# Patient Record
Sex: Male | Born: 1995 | Race: Black or African American | Hispanic: No | Marital: Single | Smoking: Current every day smoker
Health system: Southern US, Community
[De-identification: ages and names within clinical notes are randomized; demographics above are authoritative.]

## PROBLEM LIST (undated history)

## (undated) DIAGNOSIS — D649 Anemia, unspecified: Secondary | ICD-10-CM

## (undated) HISTORY — PX: NO PAST SURGERIES: SHX2092

---
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
## 2014-08-19 ENCOUNTER — Emergency Department (HOSPITAL_COMMUNITY): Payer: Self-pay

## 2014-08-19 ENCOUNTER — Emergency Department (HOSPITAL_COMMUNITY)
Admission: EM | Admit: 2014-08-19 | Discharge: 2014-08-19 | Disposition: A | Discharge status: Home / Self Care | Payer: Self-pay | Attending: Emergency Medicine | Admitting: Emergency Medicine

## 2014-08-19 ENCOUNTER — Encounter (HOSPITAL_COMMUNITY): Payer: Self-pay | Admitting: Emergency Medicine

## 2014-08-19 DIAGNOSIS — S7012XA Contusion of left thigh, initial encounter: Secondary | ICD-10-CM | POA: Insufficient documentation

## 2014-08-19 DIAGNOSIS — Y9289 Other specified places as the place of occurrence of the external cause: Secondary | ICD-10-CM | POA: Insufficient documentation

## 2014-08-19 DIAGNOSIS — S0993XA Unspecified injury of face, initial encounter: Secondary | ICD-10-CM | POA: Insufficient documentation

## 2014-08-19 DIAGNOSIS — S299XXA Unspecified injury of thorax, initial encounter: Secondary | ICD-10-CM | POA: Insufficient documentation

## 2014-08-19 DIAGNOSIS — Y9389 Activity, other specified: Secondary | ICD-10-CM | POA: Insufficient documentation

## 2014-08-19 DIAGNOSIS — S79912A Unspecified injury of left hip, initial encounter: Secondary | ICD-10-CM | POA: Insufficient documentation

## 2014-08-19 DIAGNOSIS — Z720 Tobacco use: Secondary | ICD-10-CM | POA: Insufficient documentation

## 2014-08-19 DIAGNOSIS — S8992XA Unspecified injury of left lower leg, initial encounter: Secondary | ICD-10-CM | POA: Insufficient documentation

## 2014-08-19 DIAGNOSIS — M546 Pain in thoracic spine: Secondary | ICD-10-CM

## 2014-08-19 DIAGNOSIS — M25522 Pain in left elbow: Secondary | ICD-10-CM | POA: Insufficient documentation

## 2014-08-19 DIAGNOSIS — Y998 Other external cause status: Secondary | ICD-10-CM | POA: Insufficient documentation

## 2014-08-19 DIAGNOSIS — R6884 Jaw pain: Secondary | ICD-10-CM

## 2014-08-19 MED ORDER — HYDROCODONE-ACETAMINOPHEN 5-325 MG PO TABS
2.0000 | ORAL_TABLET | Freq: Once | ORAL | Status: AC
  Administered 2014-08-19: 2 via ORAL
  Filled 2014-08-19: qty 2

## 2014-08-19 MED ORDER — TRAMADOL HCL 50 MG PO TABS: 50.0000 mg | ORAL_TABLET | Freq: Four times a day (QID) | ORAL | Status: AC | PRN
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
## 2014-08-19 NOTE — Discharge Instructions (Signed)
Take Tramadol as needed for pain. Refer to attached documents for more information.  °
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
## 2019-11-02 ENCOUNTER — Ambulatory Visit: Payer: Self-pay | Attending: Internal Medicine

## 2019-11-02 DIAGNOSIS — Z23 Encounter for immunization: Secondary | ICD-10-CM

## 2019-11-02 NOTE — Progress Notes (Signed)
   Covid-[facility] Vaccination [facility]  Name:  Rayshun Kandler    MRN: [id number] DOB: 27-Oct-1995  11/02/2019  Mr. Koppel was observed post Covid-19 immunization for 15 minutes without incident. He was provided with Vaccine Information Sheet and instruction to access the V-Safe system.   Mr. Erion was instructed to call 911 with any severe reactions post vaccine: Marland Kitchen Difficulty breathing  . Swelling of face and throat  . A fast heartbeat  . A bad rash all over body  . Dizziness and weakness   Immunizations Administered    Name Date Dose VIS Date Route   Pfizer COVID-19 Vaccine 11/02/2019  4:00 PM 0.3 mL 09/06/2018 Intramuscular   Manufacturer: [employer], [employer]   Lot: [id number]   NDC: [address]-2

## 2019-11-27 ENCOUNTER — Ambulatory Visit: Payer: Self-pay | Attending: Internal Medicine

## 2019-12-19 ENCOUNTER — Ambulatory Visit (HOSPITAL_COMMUNITY)
Admission: EM | Admit: 2019-12-19 | Discharge: 2019-12-19 | Disposition: A | Discharge status: Home / Self Care | Payer: Self-pay | Attending: Physician Assistant | Admitting: Physician Assistant

## 2019-12-19 ENCOUNTER — Other Ambulatory Visit: Payer: Self-pay

## 2019-12-19 ENCOUNTER — Encounter (HOSPITAL_COMMUNITY): Payer: Self-pay

## 2019-12-19 DIAGNOSIS — K047 Periapical abscess without sinus: Secondary | ICD-10-CM

## 2019-12-19 DIAGNOSIS — K0889 Other specified disorders of teeth and supporting structures: Secondary | ICD-10-CM

## 2019-12-19 HISTORY — DX: Anemia, unspecified: D64.9

## 2019-12-19 MED ORDER — KETOROLAC TROMETHAMINE 60 MG/2ML IM SOLN
INTRAMUSCULAR | Status: AC
  Filled 2019-12-19: qty 2

## 2019-12-19 MED ORDER — AMOXICILLIN-POT CLAVULANATE 875-125 MG PO TABS: 1.0000 | ORAL_TABLET | Freq: Two times a day (BID) | ORAL | 0 refills | Status: AC

## 2019-12-19 MED ORDER — IBUPROFEN 800 MG PO TABS: 800.0000 mg | ORAL_TABLET | Freq: Three times a day (TID) | ORAL | 0 refills | Status: AC

## 2019-12-19 MED ORDER — KETOROLAC TROMETHAMINE 60 MG/2ML IM SOLN
60.0000 mg | Freq: Once | INTRAMUSCULAR | Status: AC
  Administered 2019-12-19: 60 mg via INTRAMUSCULAR

## 2019-12-19 MED ORDER — ACETAMINOPHEN 500 MG PO TABS: 1000.0000 mg | ORAL_TABLET | Freq: Three times a day (TID) | ORAL | 0 refills | Status: AC | PRN
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
## 2019-12-19 NOTE — ED Provider Notes (Signed)
[facility]    CSN: [id number] Arrival date & time: 12/19/19  1401      History   Chief Complaint Chief Complaint  Patient presents with  . Dental Pain    HPI Clinton Ellison is a 24 y.o. male.   Patient reports urgent care for evaluation of dental pain.  He reports this is been going on for the last month.  Reports a tooth on the left lower side of his jaw has been giving him a lot of trouble.  He reports a history of having bad teeth.  He also reports some swelling in his lower part of his face and around his gums.  He does note having some foul taste occasionally.  Denies fever.  Denies difficulty swallowing.  Denies neck pain or neck swelling.  He is not have a dentist.  He has not seen a dentist for this.  He has had several bad teeth over the years.  This is been getting worse over the last few days this would prompt him to come in.     Past Medical History:  Diagnosis Date  . Anemia     There are no problems to display for this patient.   Past Surgical History:  Procedure Laterality Date  . NO PAST SURGERIES         Home Medications    Prior to Admission medications   Medication Sig Start Date End Date Taking? Authorizing Provider  acetaminophen (TYLENOL) 500 MG tablet Take 2 tablets (1,000 mg total) by mouth every 8 (eight) hours as needed. 12/19/19   Gursimran Litaker, Veryl Speak, PA-C  amoxicillin-clavulanate (AUGMENTIN) 875-125 MG tablet Take 1 tablet by mouth 2 (two) times daily for 10 days. 12/19/19 12/29/19  Adryen Cookson, Veryl Speak, PA-C  ibuprofen (ADVIL) 800 MG tablet Take 1 tablet (800 mg total) by mouth 3 (three) times daily. 12/19/19   Aveon Colquhoun, Veryl Speak, PA-C  traMADol (ULTRAM) 50 MG tablet Take 1 tablet (50 mg total) by mouth every 6 (six) hours as needed. 08/19/14   Emilia Beck, PA-C    Family History No family history on file.  Social History Social History   Tobacco Use  . Smoking status: Current Some Day Smoker    Types: Cigars  . Smokeless tobacco: Never  Used  Substance Use Topics  . Alcohol use: Yes    Comment: occ  . Drug use: No     Allergies   Patient has no known allergies.   Review of Systems Review of Systems   Physical Exam Triage Vital Signs ED Triage Vitals  Enc Vitals Group     BP 12/19/19 1503 131/70     Pulse Rate 12/19/19 1503 (!) 46     Resp 12/19/19 1503 16     Temp 12/19/19 1503 98.6 F (37 C)     Temp Source 12/19/19 1503 Oral     SpO2 12/19/19 1503 100 %     Weight --      Height --      Head Circumference --      Peak Flow --      Pain Score 12/19/19 1500 9     Pain Loc --      Pain Edu? --      Excl. in GC? --    No data found.  Updated Vital Signs BP 131/70 (BP Location: Right Arm)   Pulse (!) 46   Temp 98.6 F (37 C) (Oral)   Resp 16   SpO2 100%  Visual Acuity Right Eye Distance:   Left Eye Distance:   Bilateral Distance:    Right Eye Near:   Left Eye Near:    Bilateral Near:     Physical Exam Vitals and nursing note reviewed.  Constitutional:      General: He is not in acute distress.    Appearance: He is well-developed. He is not ill-appearing.  HENT:     Head: Normocephalic and atraumatic.     Mouth/Throat:     Lips: Pink.      Comments: Patient has 2 completely decayed left-sided lower molars.  There are several decaying teeth.  Extensive evidence of dental caries.  There is no clear abscess in the gingiva on the left lower mandible however there is gingival swelling.  Oropharynx is clear.  Patient is controlling airway well.  No trismus. Eyes:     Conjunctiva/sclera: Conjunctivae normal.  Neck:     Comments: No submandibular tenderness or submental tenderness.  No masses, induration or fluctuance appreciated in the neck space. Cardiovascular:     Rate and Rhythm: Bradycardia present.     Heart sounds: No murmur.  Pulmonary:     Effort: Pulmonary effort is normal. No respiratory distress.  Musculoskeletal:     Cervical back: Neck supple. No rigidity or  tenderness.  Lymphadenopathy:     Cervical: No cervical adenopathy.  Skin:    General: Skin is warm and dry.  Neurological:     Mental Status: He is alert.      UC Treatments / Results  Labs (all labs ordered are listed, but only abnormal results are displayed) Labs Reviewed - No data to display  EKG   Radiology No results found.  Procedures Procedures (including critical care time)  Medications Ordered in UC Medications  ketorolac (TORADOL) injection 60 mg (60 mg Intramuscular Given 12/19/19 1617)    Initial Impression / Assessment and Plan / UC Course  I have reviewed the triage vital signs and the nursing notes.  Pertinent labs & imaging results that were available during my care of the patient were reviewed by me and considered in my medical decision making (see chart for details).     #Dental pain #Dental infection Patient is 24 year old presenting with dental infection.  Dentition is very poor.  Low suspicion of the space infection today.  Patient definitively needs to see a dentist possibly oral surgeon.  We will place him on antibiotics and treat his pain.  Strict return and emergency department precautions were discussed.  Dental resources given.  Stressed the importance of having dental evaluation.  Patient verbalized understanding Final Clinical Impressions(s) / UC Diagnoses   Final diagnoses:  Dental infection  Pain, dental     Discharge Instructions     [place].[facility]Address: [address] [place], [id number]: [phone].Dr. Lavell Anchors: [address], [place], [place], [address]KGMWN: [phone]  Take the augmentin 2 times a day for 10 days Take the ibuprofen every 8 hours, you may also take tylenol every 8 hours as prescribed  You need to see a dentist ASAP.   If you notice neck swelling, difficulty swallowing, high fever, return or go to the Emergency Department      ED Prescriptions     Medication Sig Dispense Auth. Provider   amoxicillin-clavulanate (AUGMENTIN) 875-125 MG tablet Take 1 tablet by mouth 2 (two) times daily for 10 days. 20 tablet Tearah Saulsbury, Veryl Speak, PA-C   ibuprofen (ADVIL) 800 MG tablet Take 1 tablet (800 mg total) by  mouth 3 (three) times daily. 21 tablet Cambreigh Dearing, Marguerita Beards, PA-C   acetaminophen (TYLENOL) 500 MG tablet Take 2 tablets (1,000 mg total) by mouth every 8 (eight) hours as needed. 30 tablet Jessyka Austria, Marguerita Beards, PA-C     PDMP not reviewed this encounter.   Purnell Shoemaker, PA-C 12/19/19 1629

## 2019-12-19 NOTE — Discharge Instructions (Addendum)
[place].[facility]Address: [address] [place], [id number]: [phone].Dr. Lavell Anchors: [address] [place], [place], [address]HYHOO: [phone]  Take the augmentin 2 times a day for 10 days Take the ibuprofen every 8 hours, you may also take tylenol every 8 hours as prescribed  You need to see a dentist ASAP.   If you notice neck swelling, difficulty swallowing, high fever, return or go to the Emergency Department

## 2021-08-26 ENCOUNTER — Emergency Department (HOSPITAL_COMMUNITY): Payer: Self-pay

## 2021-08-26 ENCOUNTER — Emergency Department (HOSPITAL_COMMUNITY)
Admission: EM | Admit: 2021-08-26 | Discharge: 2021-08-27 | Disposition: A | Discharge status: Home / Self Care | Payer: Self-pay | Attending: Emergency Medicine | Admitting: Emergency Medicine

## 2021-08-26 DIAGNOSIS — K047 Periapical abscess without sinus: Secondary | ICD-10-CM | POA: Insufficient documentation

## 2021-08-26 LAB — CBC WITH DIFFERENTIAL/PLATELET
Abs Immature Granulocytes: 0 10*3/uL (ref 0.00–0.07)
Basophils Absolute: 0.1 10*3/uL (ref 0.0–0.1)
Basophils Relative: 1 %
Eosinophils Absolute: 0.1 10*3/uL (ref 0.0–0.5)
Eosinophils Relative: 1 %
HCT: 46.5 % (ref 39.0–52.0)
Hemoglobin: 15.2 g/dL (ref 13.0–17.0)
Lymphocytes Relative: 21 %
Lymphs Abs: 2.3 10*3/uL (ref 0.7–4.0)
MCH: 28.6 pg (ref 26.0–34.0)
MCHC: 32.7 g/dL (ref 30.0–36.0)
MCV: 87.6 fL (ref 80.0–100.0)
Monocytes Absolute: 0.9 10*3/uL (ref 0.1–1.0)
Monocytes Relative: 8 %
Neutro Abs: 7.6 10*3/uL (ref 1.7–7.7)
Neutrophils Relative %: 69 %
Platelets: 203 10*3/uL (ref 150–400)
RBC: 5.31 MIL/uL (ref 4.22–5.81)
RDW: 12.7 % (ref 11.5–15.5)
WBC: 11 10*3/uL — ABNORMAL HIGH (ref 4.0–10.5)
nRBC: 0 % (ref 0.0–0.2)
nRBC: 0 /100 WBC

## 2021-08-26 LAB — BASIC METABOLIC PANEL
Anion gap: 9 (ref 5–15)
BUN: 6 mg/dL (ref 6–20)
CO2: 26 mmol/L (ref 22–32)
Calcium: 9.4 mg/dL (ref 8.9–10.3)
Chloride: 101 mmol/L (ref 98–111)
Creatinine, Ser: 1.06 mg/dL (ref 0.61–1.24)
GFR, Estimated: >60 mL/min (ref >60)
Glucose, Bld: 97 mg/dL (ref 70–99)
Potassium: 3.4 mmol/L — ABNORMAL LOW (ref 3.5–5.1)
Sodium: 136 mmol/L (ref 135–145)

## 2021-08-26 MED ORDER — IOHEXOL 300 MG/ML  SOLN
75.0000 mL | Freq: Once | INTRAMUSCULAR | Status: AC | PRN
  Administered 2021-08-26: 75 mL via INTRAVENOUS

## 2021-08-26 NOTE — ED Provider Triage Note (Signed)
Emergency Medicine Provider Triage Evaluation Note  Clinton Ellison , a 26 y.o. male  was evaluated in triage.  Pt complains of right upper jaw pain.  Patient reports he cracked a tooth in the right upper jaw about 2 weeks ago, its been significantly worsening.  Has swelling to the right jaw, difficulty handling secretions.  Unsure if he has been having fevers at home, he has been taking pain medicine his wife gives him but "I do not know what it is".  Has not been able to see a dentist.  Review of Systems  Positive: DENTAL PAIN Negative:   Physical Exam  BP (!) 148/84    Pulse 72    Temp 100.3 F (37.9 C) (Oral)    Resp 16    SpO2 100%  Gen:   Awake, no distress   Resp:  Normal effort  MSK:   Moves extremities without difficulty  Other:  Sublingual submandibular space is soft.  There is swelling and tenderness to palpation to the right mandible.  Uvula is midline, handling secretions.  Malodorous smell, fracture to the right molar.  Medical Decision Making  Medically screening exam initiated at 9:47 PM.  Appropriate orders placed.  Clinton Ellison was informed that the remainder of the evaluation will be completed by another provider, this initial triage assessment does not replace that evaluation, and the importance of remaining in the ED until their evaluation is complete.  Patient is febrile.  Extensive swelling to the right jaw, likely just an abscess but given the extensive swelling and difficulty with mastication I do think is reasonable to proceed with CT to better evaluate the extent of the abscess.   Theron Arista, PA-C 08/26/21 2149

## 2021-08-26 NOTE — ED Triage Notes (Signed)
Pt brought to ED by [facility] ambulatory to triage with c/o right sided dental pain that has increased in severity x2 weeks. Visible swelling noted to affected area.

## 2021-08-27 ENCOUNTER — Encounter (HOSPITAL_COMMUNITY): Payer: Self-pay

## 2021-08-27 ENCOUNTER — Other Ambulatory Visit: Payer: Self-pay

## 2021-08-27 MED ORDER — CLINDAMYCIN HCL 150 MG PO CAPS: 300.0000 mg | ORAL_CAPSULE | Freq: Three times a day (TID) | ORAL | 0 refills | Status: DC

## 2021-08-27 MED ORDER — NAPROXEN 500 MG PO TABS: 500.0000 mg | ORAL_TABLET | Freq: Two times a day (BID) | ORAL | 0 refills | Status: AC

## 2021-08-27 NOTE — ED Provider Notes (Signed)
[facility] EMERGENCY DEPARTMENT Provider Note   CSN: [id number] Arrival date & time: 08/26/21  2141     History  Chief Complaint  Patient presents with   Dental Pain    Clinton Ellison is a 25 y.o. male.  The history is provided by the patient and medical records.  Dental Pain  25 y.o. M presenting to the ED with right sided facial swelling.  States it started this morning, progressing throughout the day today.  Reports some chills at home but denies fever.  No trouble swallowing.  Did have dental pain earlier but better now after medication in triage.  He is not currently established with dentist.  Home Medications Prior to Admission medications   Medication Sig Start Date End Date Taking? Authorizing Provider  acetaminophen (TYLENOL) 500 MG tablet Take 2 tablets (1,000 mg total) by mouth every 8 (eight) hours as needed. Patient taking differently: Take 1,000 mg by mouth every 8 (eight) hours as needed for moderate pain. 12/19/19  Yes Darr, Gerilyn Pilgrim, PA-C  clindamycin (CLEOCIN) 150 MG capsule Take 2 capsules (300 mg total) by mouth 3 (three) times daily. May dispense as 150mg  capsules 08/27/21  Yes 08/29/21, Allyne Gee, PA-C  naproxen (NAPROSYN) 500 MG tablet Take 1 tablet (500 mg total) by mouth 2 (two) times daily. 08/27/21  Yes 08/29/21, PA-C  ibuprofen (ADVIL) 800 MG tablet Take 1 tablet (800 mg total) by mouth 3 (three) times daily. Patient not taking: Reported on 08/26/2021 12/19/19   Darr, 02/18/20, PA-C  traMADol (ULTRAM) 50 MG tablet Take 1 tablet (50 mg total) by mouth every 6 (six) hours as needed. Patient not taking: Reported on 08/26/2021 08/19/14   10/18/14, PA-C      Allergies    Penicillins    Review of Systems   Review of Systems  HENT:  Positive for dental problem.   All other systems reviewed and are negative.  Physical Exam Updated Vital Signs BP 130/72 (BP Location: Left Arm)    Pulse (!) 55    Temp 98.6 F (37 C)    Resp 16    Ht 5'  11" (1.803 m)    Wt 72.6 kg    SpO2 100%    BMI 22.32 kg/m   Physical Exam Vitals and nursing note reviewed.  Constitutional:      Appearance: He is well-developed.  HENT:     Head: Normocephalic and atraumatic.     Mouth/Throat:     Comments: Teeth largely in poor dentition,  multiple broken and decayed teeth on right upper and lower, surrounding gingiva swollen and inflamed without discrete fluid collection, handling secretions appropriately, no trismus, no facial or neck swelling, normal phonation without stridor Eyes:     Conjunctiva/sclera: Conjunctivae normal.     Pupils: Pupils are equal, round, and reactive to light.  Cardiovascular:     Rate and Rhythm: Normal rate and regular rhythm.     Heart sounds: Normal heart sounds.  Pulmonary:     Effort: Pulmonary effort is normal.     Breath sounds: Normal breath sounds.  Abdominal:     General: Bowel sounds are normal.     Palpations: Abdomen is soft.  Musculoskeletal:        General: Normal range of motion.     Cervical back: Normal range of motion.  Skin:    General: Skin is warm and dry.  Neurological:     Mental Status: He is alert and oriented to  person, place, and time.    ED Results / Procedures / Treatments   Labs (all labs ordered are listed, but only abnormal results are displayed) Labs Reviewed  BASIC METABOLIC PANEL - Abnormal; Notable for the following components:      Result Value   Potassium 3.4 (*)    All other components within normal limits  CBC WITH DIFFERENTIAL/PLATELET - Abnormal; Notable for the following components:   WBC 11.0 (*)    All other components within normal limits    EKG None  Radiology CT Maxillofacial W Contrast  Result Date: 08/26/2021 CLINICAL DATA:  Right facial pain and swelling EXAM: CT MAXILLOFACIAL WITH CONTRAST TECHNIQUE: Multidetector CT imaging of the maxillofacial structures was performed with intravenous contrast. Multiplanar CT image reconstructions were also  generated. RADIATION DOSE REDUCTION: This exam was performed according to the departmental dose-optimization program which includes automated exposure control, adjustment of the mA and/or kV according to patient size and/or use of iterative reconstruction technique. CONTRAST:  71mL OMNIPAQUE IOHEXOL 300 MG/ML  SOLN COMPARISON:  None. FINDINGS: Osseous: There is poor dentition with numerous caries. Periapical lucency at the root of tooth 3. Orbits: Negative. No traumatic or inflammatory finding. Sinuses: Mild mucosal thickening of the right maxillary sinus. Soft tissues: Moderate soft tissue swelling of the lower right face. There is an abscess measuring 15 x 5 mm superficial to the right maxillary molars. Limited intracranial: No significant or unexpected finding. IMPRESSION: 1. Poor dentition with numerous caries and periapical lucency at the root of tooth 3. 2. 15 x 5 mm abscess superficial to the right maxillary molars with moderate soft tissue swelling of the lower right face. Electronically Signed   By: Deatra Robinson M.D.   On: 08/26/2021 23:47    Procedures Procedures    Medications Ordered in ED Medications  iohexol (OMNIPAQUE) 300 MG/ML solution 75 mL (75 mLs Intravenous Contrast Given 08/26/21 2320)    ED Course/ Medical Decision Making/ A&P                           Medical Decision Making Amount and/or Complexity of Data Reviewed Labs: ordered. Radiology: ordered and independent interpretation performed. ECG/medicine tests: ordered and independent interpretation performed.  Risk Prescription drug management.   26 y.o. M here with right sided dental pain and facial swelling-- clinically dental abscess.  He is afebrile and nontoxic.  Does have poor dentition on the right with multiple broken and decayed molars along the upper and lower jaw.  There is notable swelling of right side of face but no drooling or stridor.  He is handling secretions well, normal phonation.  Not clinically  concerning for Ludwig's angina.  Labs are overall reassuring.  CT confirms right-sided dental abscess.  No findings of deep space infection.  Will start on antibiotics and refer to dentist for follow-up.  Return here for new concerns.  Final Clinical Impression(s) / ED Diagnoses Final diagnoses:  Dental abscess    Rx / DC Orders ED Discharge Orders          Ordered    naproxen (NAPROSYN) 500 MG tablet  2 times daily        08/27/21 0600    clindamycin (CLEOCIN) 150 MG capsule  3 times daily        08/27/21 0600              Garlon Hatchet, PA-C 08/27/21 0603    Dione Booze, MD 08/27/21  0642 ° °

## 2021-08-27 NOTE — Discharge Instructions (Addendum)
Take the prescribed medication as directed. Follow-up with dentist-- call today to schedule appt. Return to the ED for new or worsening symptoms.

## 2022-01-14 ENCOUNTER — Encounter (HOSPITAL_COMMUNITY): Payer: Self-pay

## 2022-01-14 ENCOUNTER — Ambulatory Visit (HOSPITAL_COMMUNITY)
Admission: EM | Admit: 2022-01-14 | Discharge: 2022-01-14 | Disposition: A | Discharge status: Home / Self Care | Payer: Self-pay | Attending: Emergency Medicine | Admitting: Emergency Medicine

## 2022-01-14 DIAGNOSIS — B349 Viral infection, unspecified: Secondary | ICD-10-CM | POA: Insufficient documentation

## 2022-01-14 DIAGNOSIS — Z20822 Contact with and (suspected) exposure to covid-19: Secondary | ICD-10-CM | POA: Insufficient documentation

## 2022-01-14 DIAGNOSIS — R509 Fever, unspecified: Secondary | ICD-10-CM | POA: Insufficient documentation

## 2022-01-14 LAB — POC INFLUENZA A AND B ANTIGEN (URGENT CARE ONLY)
INFLUENZA A ANTIGEN, POC: NEGATIVE
INFLUENZA B ANTIGEN, POC: NEGATIVE

## 2022-01-14 LAB — SARS CORONAVIRUS 2 (TAT 6-24 HRS): SARS Coronavirus 2: NEGATIVE

## 2022-01-14 NOTE — ED Provider Notes (Signed)
[facility]    CSN: [id number] Arrival date & time: 01/14/22  1000      History   Chief Complaint Chief Complaint  Patient presents with   Fever    HPI Clinton Ellison is a 26 y.o. male. Pt c/o running nose, congestion, body aches, and fever since last night. Denies taking any meds to help symptoms.  Felt fine earlier in the day and then abruptly felt ill last night around midnight.  Felt like he had a fever and his companion reports that he was radiating heat.  Did not have a thermometer and did not take his temperature.  Has not tested self for COVID at home.      Fever   Past Medical History:  Diagnosis Date   Anemia     There are no problems to display for this patient.   Past Surgical History:  Procedure Laterality Date   NO PAST SURGERIES         Home Medications    Prior to Admission medications   Medication Sig Start Date End Date Taking? Authorizing Provider  acetaminophen (TYLENOL) 500 MG tablet Take 2 tablets (1,000 mg total) by mouth every 8 (eight) hours as needed. Patient taking differently: Take 1,000 mg by mouth every 8 (eight) hours as needed for moderate pain. 12/19/19   Darr, Gerilyn Pilgrim, PA-C  ibuprofen (ADVIL) 800 MG tablet Take 1 tablet (800 mg total) by mouth 3 (three) times daily. Patient not taking: Reported on 08/26/2021 12/19/19   Darr, Gerilyn Pilgrim, PA-C  naproxen (NAPROSYN) 500 MG tablet Take 1 tablet (500 mg total) by mouth 2 (two) times daily. 08/27/21   Garlon Hatchet, PA-C  traMADol (ULTRAM) 50 MG tablet Take 1 tablet (50 mg total) by mouth every 6 (six) hours as needed. Patient not taking: Reported on 08/26/2021 08/19/14   Emilia Beck, PA-C    Family History History reviewed. No pertinent family history.  Social History Social History   Tobacco Use   Smoking status: Every Day    Packs/day: 0.50    Years: 6.00    Total pack years: 3.00    Types: Cigarettes   Smokeless tobacco: Never  Vaping Use   Vaping Use: Never used   Substance Use Topics   Alcohol use: Yes    Comment: occ   Drug use: No     Allergies   Penicillins   Review of Systems Review of Systems  Constitutional:  Positive for fever.     Physical Exam Triage Vital Signs ED Triage Vitals  Enc Vitals Group     BP 01/14/22 1044 119/78     Pulse Rate 01/14/22 1044 (!) 54     Resp 01/14/22 1044 18     Temp 01/14/22 1044 99.5 F (37.5 C)     Temp Source 01/14/22 1044 Oral     SpO2 01/14/22 1044 98 %     Weight --      Height --      Head Circumference --      Peak Flow --      Pain Score 01/14/22 1045 8     Pain Loc --      Pain Edu? --      Excl. in GC? --    No data found.  Updated Vital Signs BP 119/78 (BP Location: Right Arm)   Pulse (!) 54   Temp 99.5 F (37.5 C) (Oral)   Resp 18   SpO2 98%   Visual Acuity Right Eye  Distance:   Left Eye Distance:   Bilateral Distance:    Right Eye Near:   Left Eye Near:    Bilateral Near:     Physical Exam Constitutional:      General: He is not in acute distress.    Appearance: Normal appearance. He is not ill-appearing.  HENT:     Right Ear: Tympanic membrane, ear canal and external ear normal.     Left Ear: Tympanic membrane, ear canal and external ear normal.     Nose: Congestion present.     Mouth/Throat:     Mouth: Mucous membranes are moist.     Pharynx: Oropharynx is clear. Posterior oropharyngeal erythema present. No oropharyngeal exudate.  Cardiovascular:     Rate and Rhythm: Normal rate and regular rhythm.  Pulmonary:     Effort: Pulmonary effort is normal.     Breath sounds: Normal breath sounds.  Lymphadenopathy:     Head:     Right side of head: No submandibular adenopathy.     Left side of head: No submandibular adenopathy.  Neurological:     Mental Status: He is alert.      UC Treatments / Results  Labs (all labs ordered are listed, but only abnormal results are displayed) Labs Reviewed  SARS CORONAVIRUS 2 (TAT 6-24 HRS)  POC INFLUENZA A  AND B ANTIGEN (URGENT CARE ONLY)    EKG   Radiology No results found.  Procedures Procedures (including critical care time)  Medications Ordered in UC Medications - No data to display  Initial Impression / Assessment and Plan / UC Course  I have reviewed the triage vital signs and the nursing notes.  Pertinent labs & imaging results that were available during my care of the patient were reviewed by me and considered in my medical decision making (see chart for details).    Given abrupt onset of symptoms, tested for flu but results of rapid test are negative.  COVID test results pending.  Likely has some kind of viral illness.  Reviewed supportive care measures.  Given note for work.  Final Clinical Impressions(s) / UC Diagnoses   Final diagnoses:  Viral illness     Discharge Instructions      Use ibuprofen per package directions if needed for fever or pain. Use over the counter cold medicines such as Mucinex (ok to use generic guaifenesin) to help manage your symptoms.   Try using saline irrigation, such as with a neti pot, several times a day while you are sick. Many neti pots come with salt packets premeasured to use to make saline. If you use your own salt, make sure it is kosher salt or sea salt (don't use table salt as it has iodine in it and you don't need that in your nose). Use distilled water to make saline. If you mix your own saline using your own salt, the recipe is 1/4 teaspoon salt in 1 cup warm water. Using saline irrigation can help prevent and treat sinus infections.  If you prefer to not use saline irrigation, you can use saline spray several times a day to help your nasal congestion drain.    ED Prescriptions   None    PDMP not reviewed this encounter.   Cathlyn Parsons, NP 01/14/22 1135

## 2022-01-14 NOTE — ED Triage Notes (Signed)
Pt c/o running nose, congestion, body aches, and fever since last night. Denies taking any meds.

## 2022-01-14 NOTE — Discharge Instructions (Addendum)
Use ibuprofen per package directions if needed for fever or pain. Use over the counter cold medicines such as Mucinex (ok to use generic guaifenesin) to help manage your symptoms.   Try using saline irrigation, such as with a neti pot, several times a day while you are sick. Many neti pots come with salt packets premeasured to use to make saline. If you use your own salt, make sure it is kosher salt or sea salt (don't use table salt as it has iodine in it and you don't need that in your nose). Use distilled water to make saline. If you mix your own saline using your own salt, the recipe is 1/4 teaspoon salt in 1 cup warm water. Using saline irrigation can help prevent and treat sinus infections.  If you prefer to not use saline irrigation, you can use saline spray several times a day to help your nasal congestion drain.
